# Patient Record
Sex: Male | Born: 1960 | Race: White | Hispanic: No | Marital: Married | Smoking: Former smoker
Health system: Southern US, Community
[De-identification: ages and names within clinical notes are randomized; demographics above are authoritative.]

## PROBLEM LIST (undated history)

## (undated) DIAGNOSIS — J45909 Unspecified asthma, uncomplicated: Secondary | ICD-10-CM

---

## 2018-02-11 ENCOUNTER — Inpatient Hospital Stay
Admission: EM | Admit: 2018-02-11 | Discharge: 2018-02-16 | DRG: 100 | Disposition: A | Discharge status: Home / Self Care | Payer: BLUE CROSS/BLUE SHIELD | Attending: Specialist | Admitting: Specialist

## 2018-02-11 ENCOUNTER — Encounter: Payer: Self-pay | Admitting: Emergency Medicine

## 2018-02-11 ENCOUNTER — Emergency Department: Payer: BLUE CROSS/BLUE SHIELD

## 2018-02-11 ENCOUNTER — Other Ambulatory Visit: Payer: Self-pay

## 2018-02-11 DIAGNOSIS — G4733 Obstructive sleep apnea (adult) (pediatric): Secondary | ICD-10-CM | POA: Diagnosis present

## 2018-02-11 DIAGNOSIS — J690 Pneumonitis due to inhalation of food and vomit: Secondary | ICD-10-CM | POA: Diagnosis present

## 2018-02-11 DIAGNOSIS — I4891 Unspecified atrial fibrillation: Secondary | ICD-10-CM | POA: Diagnosis present

## 2018-02-11 DIAGNOSIS — Z8249 Family history of ischemic heart disease and other diseases of the circulatory system: Secondary | ICD-10-CM | POA: Diagnosis not present

## 2018-02-11 DIAGNOSIS — S32011A Stable burst fracture of first lumbar vertebra, initial encounter for closed fracture: Secondary | ICD-10-CM

## 2018-02-11 DIAGNOSIS — J45909 Unspecified asthma, uncomplicated: Secondary | ICD-10-CM | POA: Diagnosis present

## 2018-02-11 DIAGNOSIS — R739 Hyperglycemia, unspecified: Secondary | ICD-10-CM | POA: Diagnosis not present

## 2018-02-11 DIAGNOSIS — Z79899 Other long term (current) drug therapy: Secondary | ICD-10-CM

## 2018-02-11 DIAGNOSIS — D696 Thrombocytopenia, unspecified: Secondary | ICD-10-CM | POA: Diagnosis present

## 2018-02-11 DIAGNOSIS — Z7982 Long term (current) use of aspirin: Secondary | ICD-10-CM | POA: Diagnosis not present

## 2018-02-11 DIAGNOSIS — R569 Unspecified convulsions: Secondary | ICD-10-CM

## 2018-02-11 DIAGNOSIS — Z885 Allergy status to narcotic agent status: Secondary | ICD-10-CM | POA: Diagnosis not present

## 2018-02-11 DIAGNOSIS — Z87891 Personal history of nicotine dependence: Secondary | ICD-10-CM

## 2018-02-11 DIAGNOSIS — R200 Anesthesia of skin: Secondary | ICD-10-CM | POA: Diagnosis present

## 2018-02-11 DIAGNOSIS — S01512A Laceration without foreign body of oral cavity, initial encounter: Secondary | ICD-10-CM | POA: Diagnosis present

## 2018-02-11 DIAGNOSIS — R202 Paresthesia of skin: Secondary | ICD-10-CM | POA: Diagnosis present

## 2018-02-11 DIAGNOSIS — M549 Dorsalgia, unspecified: Secondary | ICD-10-CM | POA: Diagnosis present

## 2018-02-11 DIAGNOSIS — I10 Essential (primary) hypertension: Secondary | ICD-10-CM | POA: Diagnosis present

## 2018-02-11 DIAGNOSIS — J9601 Acute respiratory failure with hypoxia: Secondary | ICD-10-CM | POA: Diagnosis present

## 2018-02-11 DIAGNOSIS — G40409 Other generalized epilepsy and epileptic syndromes, not intractable, without status epilepticus: Principal | ICD-10-CM | POA: Diagnosis present

## 2018-02-11 DIAGNOSIS — F10231 Alcohol dependence with withdrawal delirium: Secondary | ICD-10-CM | POA: Diagnosis not present

## 2018-02-11 DIAGNOSIS — G40909 Epilepsy, unspecified, not intractable, without status epilepticus: Secondary | ICD-10-CM | POA: Diagnosis not present

## 2018-02-11 DIAGNOSIS — M545 Low back pain: Secondary | ICD-10-CM | POA: Diagnosis not present

## 2018-02-11 DIAGNOSIS — J9602 Acute respiratory failure with hypercapnia: Secondary | ICD-10-CM | POA: Diagnosis not present

## 2018-02-11 DIAGNOSIS — G8929 Other chronic pain: Secondary | ICD-10-CM | POA: Diagnosis not present

## 2018-02-11 DIAGNOSIS — R0602 Shortness of breath: Secondary | ICD-10-CM

## 2018-02-11 DIAGNOSIS — J9600 Acute respiratory failure, unspecified whether with hypoxia or hypercapnia: Secondary | ICD-10-CM

## 2018-02-11 DIAGNOSIS — F10931 Alcohol use, unspecified with withdrawal delirium: Secondary | ICD-10-CM

## 2018-02-11 HISTORY — DX: Unspecified asthma, uncomplicated: J45.909

## 2018-02-11 LAB — TYPE AND SCREEN
ABO/RH(D): A POS
Antibody Screen: NEGATIVE

## 2018-02-11 LAB — COMPREHENSIVE METABOLIC PANEL
ALT: 77 U/L — ABNORMAL HIGH (ref 0–44)
AST: 132 U/L — ABNORMAL HIGH (ref 15–41)
Albumin: 4.5 g/dL (ref 3.5–5.0)
Alkaline Phosphatase: 63 U/L (ref 38–126)
Anion gap: 13 (ref 5–15)
BILIRUBIN TOTAL: 2 mg/dL — AB (ref 0.3–1.2)
BUN: 14 mg/dL (ref 6–20)
CHLORIDE: 103 mmol/L (ref 98–111)
CO2: 22 mmol/L (ref 22–32)
Calcium: 9.2 mg/dL (ref 8.9–10.3)
Creatinine, Ser: 1.12 mg/dL (ref 0.61–1.24)
GFR calc Af Amer: >60 mL/min (ref >60)
Glucose, Bld: 172 mg/dL — ABNORMAL HIGH (ref 70–99)
Potassium: 3.6 mmol/L (ref 3.5–5.1)
Sodium: 138 mmol/L (ref 135–145)
Total Protein: 7.6 g/dL (ref 6.5–8.1)

## 2018-02-11 LAB — APTT: aPTT: 26 seconds (ref 24–36)

## 2018-02-11 LAB — CBC
HEMATOCRIT: 44.2 % (ref 40.0–52.0)
Hemoglobin: 15.3 g/dL (ref 13.0–18.0)
MCH: 34.4 pg — ABNORMAL HIGH (ref 26.0–34.0)
MCHC: 34.5 g/dL (ref 32.0–36.0)
MCV: 99.9 fL (ref 80.0–100.0)
PLATELETS: 112 10*3/uL — AB (ref 150–440)
RBC: 4.43 MIL/uL (ref 4.40–5.90)
RDW: 13.4 % (ref 11.5–14.5)
WBC: 6.2 10*3/uL (ref 3.8–10.6)

## 2018-02-11 LAB — MRSA PCR SCREENING: MRSA BY PCR: NEGATIVE

## 2018-02-11 LAB — PROCALCITONIN: Procalcitonin: 0.1 ng/mL

## 2018-02-11 LAB — GLUCOSE, CAPILLARY: Glucose-Capillary: 177 mg/dL — ABNORMAL HIGH (ref 70–99)

## 2018-02-11 LAB — TROPONIN I

## 2018-02-11 LAB — PROTIME-INR
INR: 1.03
Prothrombin Time: 13.4 seconds (ref 11.4–15.2)

## 2018-02-11 MED ORDER — SODIUM CHLORIDE 0.9 % IV SOLN
INTRAVENOUS | Status: DC
  Administered 2018-02-11: 22:00:00 via INTRAVENOUS
  Administered 2018-02-12: 100 mL/h via INTRAVENOUS

## 2018-02-11 MED ORDER — LEVETIRACETAM IN NACL 1000 MG/100ML IV SOLN
1000.0000 mg | Freq: Once | INTRAVENOUS | Status: AC
  Administered 2018-02-11: 1000 mg via INTRAVENOUS
  Filled 2018-02-11: qty 100

## 2018-02-11 MED ORDER — METOPROLOL SUCCINATE ER 50 MG PO TB24
150.0000 mg | ORAL_TABLET | Freq: Every day | ORAL | Status: DC
  Administered 2018-02-12 – 2018-02-16 (×4): 150 mg via ORAL
  Filled 2018-02-11: qty 3
  Filled 2018-02-11: qty 1
  Filled 2018-02-11 (×2): qty 3

## 2018-02-11 MED ORDER — FENTANYL CITRATE (PF) 100 MCG/2ML IJ SOLN
50.0000 ug | Freq: Once | INTRAMUSCULAR | Status: AC
  Administered 2018-02-11: 50 ug via INTRAVENOUS
  Filled 2018-02-11: qty 2

## 2018-02-11 MED ORDER — SODIUM CHLORIDE 0.9 % IV SOLN
500.0000 mg | Freq: Two times a day (BID) | INTRAVENOUS | Status: DC
  Administered 2018-02-11: 500 mg via INTRAVENOUS
  Filled 2018-02-11 (×2): qty 5
  Filled 2018-02-11: qty 525

## 2018-02-11 MED ORDER — ADENOSINE 6 MG/2ML IV SOLN
INTRAVENOUS | Status: AC
  Filled 2018-02-11: qty 2

## 2018-02-11 MED ORDER — ONDANSETRON HCL 4 MG/2ML IJ SOLN
4.0000 mg | Freq: Once | INTRAMUSCULAR | Status: AC
  Administered 2018-02-11: 4 mg via INTRAVENOUS
  Filled 2018-02-11: qty 2

## 2018-02-11 MED ORDER — ASPIRIN EC 81 MG PO TBEC
81.0000 mg | DELAYED_RELEASE_TABLET | Freq: Every day | ORAL | Status: DC
  Administered 2018-02-12: 81 mg via ORAL
  Filled 2018-02-11: qty 1

## 2018-02-11 MED ORDER — GATIFLOXACIN 0.5 % OP SOLN
1.0000 [drp] | Freq: Two times a day (BID) | OPHTHALMIC | Status: DC
  Filled 2018-02-11: qty 2.5

## 2018-02-11 MED ORDER — ACETAMINOPHEN 325 MG PO TABS
650.0000 mg | ORAL_TABLET | Freq: Four times a day (QID) | ORAL | Status: DC | PRN
  Administered 2018-02-12: 650 mg via ORAL
  Filled 2018-02-11: qty 2

## 2018-02-11 MED ORDER — IOPAMIDOL (ISOVUE-300) INJECTION 61%
100.0000 mL | Freq: Once | INTRAVENOUS | Status: AC | PRN
  Administered 2018-02-11: 100 mL via INTRAVENOUS

## 2018-02-11 MED ORDER — IPRATROPIUM-ALBUTEROL 0.5-2.5 (3) MG/3ML IN SOLN
3.0000 mL | Freq: Four times a day (QID) | RESPIRATORY_TRACT | Status: DC | PRN
  Administered 2018-02-14 (×2): 3 mL via RESPIRATORY_TRACT
  Filled 2018-02-11 (×2): qty 3

## 2018-02-11 MED ORDER — ACETAMINOPHEN 650 MG RE SUPP: 650.0000 mg | Freq: Four times a day (QID) | RECTAL | Status: DC | PRN

## 2018-02-11 MED ORDER — OXYCODONE-ACETAMINOPHEN 2.5-325 MG PO TABS: 1.0000 | ORAL_TABLET | ORAL | 0 refills | Status: DC | PRN

## 2018-02-11 MED ORDER — ONDANSETRON HCL 4 MG/2ML IJ SOLN
4.0000 mg | Freq: Once | INTRAMUSCULAR | Status: AC
  Administered 2018-02-11: 4 mg via INTRAVENOUS

## 2018-02-11 MED ORDER — FENTANYL CITRATE (PF) 100 MCG/2ML IJ SOLN
100.0000 ug | Freq: Once | INTRAMUSCULAR | Status: AC
  Administered 2018-02-11: 100 ug via INTRAVENOUS
  Filled 2018-02-11: qty 2

## 2018-02-11 MED ORDER — ONDANSETRON HCL 4 MG/2ML IJ SOLN
INTRAMUSCULAR | Status: AC
  Filled 2018-02-11: qty 2

## 2018-02-11 MED ORDER — SODIUM CHLORIDE 0.9 % IV SOLN
1000.0000 mL | Freq: Once | INTRAVENOUS | Status: AC
  Administered 2018-02-11: 1000 mL via INTRAVENOUS

## 2018-02-11 MED ORDER — ONDANSETRON HCL 4 MG/2ML IJ SOLN
4.0000 mg | Freq: Four times a day (QID) | INTRAMUSCULAR | Status: DC | PRN
  Administered 2018-02-13: 4 mg via INTRAVENOUS
  Filled 2018-02-11 (×2): qty 2

## 2018-02-11 MED ORDER — SODIUM CHLORIDE 0.9 % IV BOLUS
1000.0000 mL | Freq: Once | INTRAVENOUS | Status: AC
  Administered 2018-02-11: 1000 mL via INTRAVENOUS

## 2018-02-11 MED ORDER — FENTANYL CITRATE (PF) 100 MCG/2ML IJ SOLN
INTRAMUSCULAR | Status: AC
  Administered 2018-02-11: 50 ug via INTRAVENOUS
  Filled 2018-02-11: qty 2

## 2018-02-11 MED ORDER — ONDANSETRON HCL 4 MG PO TABS: 4.0000 mg | ORAL_TABLET | Freq: Four times a day (QID) | ORAL | Status: DC | PRN

## 2018-02-11 MED ORDER — LORAZEPAM 2 MG/ML IJ SOLN
2.0000 mg | INTRAMUSCULAR | Status: DC | PRN
  Administered 2018-02-13: 2 mg via INTRAVENOUS
  Administered 2018-02-13: 1 mg via INTRAVENOUS
  Filled 2018-02-11 (×2): qty 1

## 2018-02-11 MED ORDER — TETANUS-DIPHTH-ACELL PERTUSSIS 5-2.5-18.5 LF-MCG/0.5 IM SUSP
INTRAMUSCULAR | Status: AC
  Administered 2018-02-11: 0.5 mL via INTRAMUSCULAR
  Filled 2018-02-11: qty 0.5

## 2018-02-11 MED ORDER — TETANUS-DIPHTH-ACELL PERTUSSIS 5-2.5-18.5 LF-MCG/0.5 IM SUSP
0.5000 mL | Freq: Once | INTRAMUSCULAR | Status: AC
  Administered 2018-02-11: 0.5 mL via INTRAMUSCULAR

## 2018-02-11 MED ORDER — ADENOSINE 12 MG/4ML IV SOLN
INTRAVENOUS | Status: AC
  Filled 2018-02-11: qty 4

## 2018-02-11 MED ORDER — FENTANYL CITRATE (PF) 100 MCG/2ML IJ SOLN
50.0000 ug | Freq: Once | INTRAMUSCULAR | Status: AC
  Administered 2018-02-11: 50 ug via INTRAVENOUS

## 2018-02-11 MED ORDER — MORPHINE SULFATE (PF) 4 MG/ML IV SOLN: 4.0000 mg | Freq: Once | INTRAVENOUS | Status: DC

## 2018-02-11 MED ORDER — LEVETIRACETAM IN NACL 500 MG/100ML IV SOLN: 500.0000 mg | Freq: Two times a day (BID) | INTRAVENOUS | Status: DC

## 2018-02-11 MED ORDER — METOPROLOL TARTRATE 5 MG/5ML IV SOLN
2.5000 mg | INTRAVENOUS | Status: DC | PRN
  Administered 2018-02-14: 2.5 mg via INTRAVENOUS
  Filled 2018-02-11: qty 5

## 2018-02-11 MED ORDER — ENOXAPARIN SODIUM 40 MG/0.4ML ~~LOC~~ SOLN
40.0000 mg | SUBCUTANEOUS | Status: DC
  Administered 2018-02-11 – 2018-02-12 (×2): 40 mg via SUBCUTANEOUS
  Filled 2018-02-11 (×2): qty 0.4

## 2018-02-11 MED ORDER — LORAZEPAM 2 MG/ML IJ SOLN
2.0000 mg | Freq: Once | INTRAMUSCULAR | Status: AC
  Administered 2018-02-11: 2 mg via INTRAVENOUS

## 2018-02-11 NOTE — ED Notes (Signed)
Pt desat to 92% on 2L after receiving 100mcg of Fentanyl, pt up to 3L via [place], O2 sats 96% on 3L. Dr. Marcell BarlowYarborough also to bedside at this time.

## 2018-02-11 NOTE — ED Notes (Signed)
TLSO brace applied by this RN and Dorian, EDT.

## 2018-02-11 NOTE — ED Notes (Signed)
Pt being transported to MRI by Joni ReiningNicole, EDT. As discussed with MRI, TLSO brace to be applied after patient has had MRI of Lumbar Spine.

## 2018-02-11 NOTE — ED Notes (Signed)
Called [employer] Tech for TLSO brace   [phone]

## 2018-02-11 NOTE — ED Notes (Signed)
Explained to patient 20 min med hold then discharge, patient and family state understanding. Care handoff to Beaulah Corinebecca U, RN.

## 2018-02-11 NOTE — ED Notes (Addendum)
Patient was in wheelchair for d/c and IV was pulled and nurse was wheeling patient out the door of room and patient seized and bit tongue and turned grey and pale and and nurses and dr rushed into help and was placed back on bed and IV were started, ativan was given, EKG were done and patient's vitals were obtained and placed on non-rebreather at 15L and patient has snoring respirations and non-arousable at this time.

## 2018-02-11 NOTE — ED Notes (Signed)
Pt returned from MRI °

## 2018-02-11 NOTE — ED Notes (Signed)
Patient opened eye and said hi to wife when she talked to him. Patient back to snoring and rest.
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
## 2018-02-11 NOTE — ED Notes (Signed)
Dr. Pershing ProudSchaevitz at bedside to assess patient and patient slightly stirred.

## 2018-02-11 NOTE — ED Notes (Signed)
Family at bedside. 

## 2018-02-11 NOTE — ED Notes (Signed)
Pt given phone for MRI screening.  

## 2018-02-11 NOTE — ED Notes (Signed)
Admitting MD at bedside to talk to wife about admission. Verbal order for zofran obtained for nausea because patient stated he felt like he was going to throw up
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
## 2018-02-11 NOTE — ED Notes (Signed)
EDP to bedside at this time 
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
## 2018-02-11 NOTE — ED Notes (Signed)
Pt returned from CT with this RN. Pt no longer noted to be diaphoretic, HR now 103-106. Pt continues to c/o pain to lower back. BP 120/90 at this time.
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
## 2018-02-11 NOTE — ED Notes (Addendum)
MD present in room to assess patient. Patient responded to touch and was aroused by touch and voice and squeezed MD's hands and wiggled feet on command.

## 2018-02-11 NOTE — ED Notes (Signed)
Patient was repositioned and then had two episodes of vomiting. Patient was able to maintain O2 for a few min off oxygen while vomiting and placed back on non-rebreather after vomiting episodes.

## 2018-02-11 NOTE — Discharge Instructions (Addendum)
Do not drive until cleared by a physician.
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
## 2018-02-11 NOTE — ED Notes (Signed)
Rebecca RN, aware of bed assigned 
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
## 2018-02-12 ENCOUNTER — Inpatient Hospital Stay: Payer: BLUE CROSS/BLUE SHIELD

## 2018-02-12 DIAGNOSIS — R569 Unspecified convulsions: Secondary | ICD-10-CM

## 2018-02-12 LAB — URINE DRUG SCREEN, QUALITATIVE (ARMC ONLY)
AMPHETAMINES, UR SCREEN: NOT DETECTED
BENZODIAZEPINE, UR SCRN: NOT DETECTED
Barbiturates, Ur Screen: NOT DETECTED
Cannabinoid 50 Ng, Ur ~~LOC~~: NOT DETECTED
Cocaine Metabolite,Ur ~~LOC~~: NOT DETECTED
MDMA (Ecstasy)Ur Screen: NOT DETECTED
METHADONE SCREEN, URINE: NOT DETECTED
OPIATE, UR SCREEN: NOT DETECTED
PHENCYCLIDINE (PCP) UR S: NOT DETECTED
Tricyclic, Ur Screen: NOT DETECTED

## 2018-02-12 LAB — BASIC METABOLIC PANEL
ANION GAP: 7 (ref 5–15)
BUN: 13 mg/dL (ref 6–20)
CALCIUM: 8 mg/dL — AB (ref 8.9–10.3)
CHLORIDE: 105 mmol/L (ref 98–111)
CO2: 26 mmol/L (ref 22–32)
Creatinine, Ser: 1.03 mg/dL (ref 0.61–1.24)
GFR calc non Af Amer: >60 mL/min (ref >60)
GLUCOSE: 168 mg/dL — AB (ref 70–99)
POTASSIUM: 3.1 mmol/L — AB (ref 3.5–5.1)
SODIUM: 138 mmol/L (ref 135–145)

## 2018-02-12 LAB — PHOSPHORUS: Phosphorus: 2.9 mg/dL (ref 2.5–4.6)

## 2018-02-12 LAB — MAGNESIUM: Magnesium: 1.9 mg/dL (ref 1.7–2.4)

## 2018-02-12 LAB — CBC
HEMATOCRIT: 36.5 % — AB (ref 40.0–52.0)
HEMOGLOBIN: 12.7 g/dL — AB (ref 13.0–18.0)
MCH: 34.9 pg — ABNORMAL HIGH (ref 26.0–34.0)
MCHC: 34.9 g/dL (ref 32.0–36.0)
MCV: 100 fL (ref 80.0–100.0)
Platelets: 81 10*3/uL — ABNORMAL LOW (ref 150–440)
RBC: 3.65 MIL/uL — AB (ref 4.40–5.90)
RDW: 13.4 % (ref 11.5–14.5)
WBC: 8.3 10*3/uL (ref 3.8–10.6)

## 2018-02-12 LAB — GLUCOSE, CAPILLARY
GLUCOSE-CAPILLARY: 206 mg/dL — AB (ref 70–99)
Glucose-Capillary: 123 mg/dL — ABNORMAL HIGH (ref 70–99)

## 2018-02-12 MED ORDER — INSULIN ASPART 100 UNIT/ML ~~LOC~~ SOLN: 0.0000 [IU] | Freq: Every day | SUBCUTANEOUS | Status: DC

## 2018-02-12 MED ORDER — MAGNESIUM SULFATE IN D5W 1-5 GM/100ML-% IV SOLN
1.0000 g | Freq: Once | INTRAVENOUS | Status: AC
  Administered 2018-02-12: 1 g via INTRAVENOUS
  Filled 2018-02-12: qty 100

## 2018-02-12 MED ORDER — LEVETIRACETAM 500 MG PO TABS
500.0000 mg | ORAL_TABLET | Freq: Two times a day (BID) | ORAL | Status: DC
  Administered 2018-02-12 (×2): 500 mg via ORAL
  Filled 2018-02-12 (×5): qty 1

## 2018-02-12 MED ORDER — TOBRAMYCIN 0.3 % OP SOLN
1.0000 [drp] | Freq: Four times a day (QID) | OPHTHALMIC | Status: DC
  Administered 2018-02-12 – 2018-02-14 (×8): 1 [drp] via OPHTHALMIC
  Filled 2018-02-12: qty 5

## 2018-02-12 MED ORDER — FAMOTIDINE 20 MG PO TABS
20.0000 mg | ORAL_TABLET | Freq: Two times a day (BID) | ORAL | Status: DC
  Administered 2018-02-12: 20 mg via ORAL
  Filled 2018-02-12: qty 1

## 2018-02-12 MED ORDER — KETOROLAC TROMETHAMINE 15 MG/ML IJ SOLN
15.0000 mg | Freq: Four times a day (QID) | INTRAMUSCULAR | Status: DC | PRN
  Administered 2018-02-12 – 2018-02-15 (×4): 15 mg via INTRAVENOUS
  Filled 2018-02-12 (×4): qty 1

## 2018-02-12 MED ORDER — GADOBENATE DIMEGLUMINE 529 MG/ML IV SOLN
20.0000 mL | Freq: Once | INTRAVENOUS | Status: AC | PRN
  Administered 2018-02-12: 20 mL via INTRAVENOUS

## 2018-02-12 MED ORDER — PSYLLIUM 95 % PO PACK: 1.0000 | PACK | Freq: Every day | ORAL | Status: DC

## 2018-02-12 MED ORDER — POTASSIUM CHLORIDE 2 MEQ/ML IV SOLN
INTRAVENOUS | Status: DC
  Administered 2018-02-12 (×2): via INTRAVENOUS
  Filled 2018-02-12 (×4): qty 1000

## 2018-02-12 MED ORDER — POTASSIUM CHLORIDE 10 MEQ/100ML IV SOLN
10.0000 meq | INTRAVENOUS | Status: AC
  Administered 2018-02-12 (×4): 10 meq via INTRAVENOUS
  Filled 2018-02-12 (×4): qty 100

## 2018-02-12 MED ORDER — INSULIN ASPART 100 UNIT/ML ~~LOC~~ SOLN
0.0000 [IU] | Freq: Three times a day (TID) | SUBCUTANEOUS | Status: DC
  Administered 2018-02-14 – 2018-02-15 (×3): 2 [IU] via SUBCUTANEOUS
  Administered 2018-02-15 – 2018-02-16 (×2): 3 [IU] via SUBCUTANEOUS
  Administered 2018-02-16: 2 [IU] via SUBCUTANEOUS
  Filled 2018-02-12 (×6): qty 1

## 2018-02-12 MED ORDER — PSYLLIUM 95 % PO PACK
1.0000 | PACK | Freq: Every day | ORAL | Status: DC
  Administered 2018-02-12 – 2018-02-15 (×2): 1 via ORAL
  Filled 2018-02-12 (×5): qty 1

## 2018-02-12 MED ORDER — ASPIRIN 81 MG PO CHEW
81.0000 mg | CHEWABLE_TABLET | Freq: Every day | ORAL | Status: DC
  Administered 2018-02-13 – 2018-02-16 (×3): 81 mg via ORAL
  Filled 2018-02-12 (×3): qty 1

## 2018-02-12 MED ORDER — POTASSIUM CHLORIDE 2 MEQ/ML IV SOLN
INTRAVENOUS | Status: DC
  Filled 2018-02-12 (×2): qty 1000

## 2018-02-12 NOTE — Procedures (Signed)
ELECTROENCEPHALOGRAM REPORT   Patient: Bryan Ballard [place]       Room #: IC17A-AA EEG No. ID: [id number] Age: 57 y.o.        Sex: male Referring Physician: Salary Report Date:  02/12/2018        Interpreting Physician: Thana FarrEYNOLDS, Tito Ausmus  History: Bryan Ballard Karman is an 57 y.o. male with new onset seizures  Medications:  ASA, Keppra, Magnesium Sulfate, Toprol  Conditions of Recording:  This is a 21 channel routine scalp EEG performed with bipolar and monopolar montages arranged in accordance to the international 10/20 system of electrode placement. One channel was dedicated to EKG recording.  The patient is in the awake state.  Description:  Muscle and movement artifact are prominent during the recording.  It often obscures the background rhythm and preventing it from being  to be evaluated.  There are rare occasions that a posterior background rhythm can be appreciated at 9 Hz alpha.   The patient does not appear to drowse or sleep. Hyperventilation and intermittent photic stimulation were not performed   IMPRESSION: This is a technically difficult electroencephalogram secondary to the predominance of muscle and movement artifact that obscures the background rhythm and precludes it evaluation.     Thana FarrLeslie Nox Talent, MD Neurology [phone] 02/12/2018, 3:17 PM
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
## 2018-02-12 NOTE — Progress Notes (Signed)
   02/12/18 0000  Clinical Encounter Type  Visited With Patient  Visit Type Initial;Spiritual support  Referral From Nurse  Spiritual Encounters  Spiritual Needs Prayer   Chaplain prayed for the sleeping patient and told the nurse that a chaplain would return on 02/12/2018 to pray with the patient. The OR remains open.

## 2018-02-12 NOTE — Progress Notes (Signed)
Patient is still sleeping. Chaplain will forward OR for prayer to the incoming on-call chaplain.

## 2018-02-12 NOTE — Progress Notes (Signed)
   02/12/18 1120  Clinical Encounter Type  Visited With Family  Visit Type Follow-up   Chaplain attempted to follow up on the order requisition.  Patient had not yet returned from MRI. Chaplain engaged patient's spouse regarding faith and patient's health situation.  Patient did return to room yet had some personal care issues to tend to.  Spouse asked chaplain to return later today.
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
## 2018-02-12 NOTE — Progress Notes (Signed)
eeg completed ° °

## 2018-02-12 NOTE — Progress Notes (Signed)
   02/12/18 1315  Clinical Encounter Type  Visited With Patient and family together  Visit Type Follow-up  Referral From Nurse  Consult/Referral To Chaplain  Spiritual Encounters  Spiritual Needs Emotional;Prayer   Chaplain followed up with patient and family regarding order for prayer.  Patient spoke about need for rest.  Chaplain outlined ongoing availability and encouraged patient/family to reach out as needed.  Chaplain opened prayer with patient and spouse, allowing them space to offer their prayers as well.

## 2018-02-12 NOTE — Progress Notes (Signed)
Patient left unit at this time to go for an MRI of the brain.

## 2018-02-13 ENCOUNTER — Inpatient Hospital Stay: Payer: BLUE CROSS/BLUE SHIELD

## 2018-02-13 DIAGNOSIS — G8929 Other chronic pain: Secondary | ICD-10-CM

## 2018-02-13 DIAGNOSIS — J9601 Acute respiratory failure with hypoxia: Secondary | ICD-10-CM

## 2018-02-13 DIAGNOSIS — M545 Low back pain: Secondary | ICD-10-CM

## 2018-02-13 DIAGNOSIS — F10231 Alcohol dependence with withdrawal delirium: Secondary | ICD-10-CM

## 2018-02-13 DIAGNOSIS — G40909 Epilepsy, unspecified, not intractable, without status epilepticus: Secondary | ICD-10-CM

## 2018-02-13 DIAGNOSIS — J9602 Acute respiratory failure with hypercapnia: Secondary | ICD-10-CM

## 2018-02-13 LAB — BLOOD GAS, ARTERIAL
Acid-Base Excess: 1 mmol/L (ref 0.0–2.0)
Acid-Base Excess: 1.1 mmol/L (ref 0.0–2.0)
Bicarbonate: 29.8 mmol/L — ABNORMAL HIGH (ref 20.0–28.0)
Bicarbonate: 27.9 mmol/L (ref 20.0–28.0)
FIO2: 0.45
FIO2: 0.45
O2 Saturation: 95.1 %
O2 Saturation: 91.5 %
PATIENT TEMPERATURE: 37
PCO2 ART: 53 mmHg — AB (ref 32.0–48.0)
PH ART: 7.33 — AB (ref 7.350–7.450)
Patient temperature: 37
pCO2 arterial: 68 mmHg (ref 32.0–48.0)
pH, Arterial: 7.25 — ABNORMAL LOW (ref 7.350–7.450)
pO2, Arterial: 88 mmHg (ref 83.0–108.0)
pO2, Arterial: 67 mmHg — ABNORMAL LOW (ref 83.0–108.0)

## 2018-02-13 LAB — CBC
HEMATOCRIT: 34.3 % — AB (ref 40.0–52.0)
HEMOGLOBIN: 12 g/dL — AB (ref 13.0–18.0)
MCH: 35 pg — ABNORMAL HIGH (ref 26.0–34.0)
MCHC: 34.9 g/dL (ref 32.0–36.0)
MCV: 100.2 fL — ABNORMAL HIGH (ref 80.0–100.0)
Platelets: 67 10*3/uL — ABNORMAL LOW (ref 150–440)
RBC: 3.42 MIL/uL — ABNORMAL LOW (ref 4.40–5.90)
RDW: 13.5 % (ref 11.5–14.5)
WBC: 5.9 10*3/uL (ref 3.8–10.6)

## 2018-02-13 LAB — MAGNESIUM: Magnesium: 1.9 mg/dL (ref 1.7–2.4)

## 2018-02-13 LAB — BASIC METABOLIC PANEL
ANION GAP: 7 (ref 5–15)
BUN: 12 mg/dL (ref 6–20)
CHLORIDE: 102 mmol/L (ref 98–111)
CO2: 25 mmol/L (ref 22–32)
CREATININE: 1.09 mg/dL (ref 0.61–1.24)
Calcium: 7.7 mg/dL — ABNORMAL LOW (ref 8.9–10.3)
GFR calc non Af Amer: >60 mL/min (ref >60)
Glucose, Bld: 120 mg/dL — ABNORMAL HIGH (ref 70–99)
Potassium: 3.3 mmol/L — ABNORMAL LOW (ref 3.5–5.1)
Sodium: 134 mmol/L — ABNORMAL LOW (ref 135–145)

## 2018-02-13 LAB — GLUCOSE, CAPILLARY
GLUCOSE-CAPILLARY: 110 mg/dL — AB (ref 70–99)
Glucose-Capillary: 120 mg/dL — ABNORMAL HIGH (ref 70–99)
Glucose-Capillary: 104 mg/dL — ABNORMAL HIGH (ref 70–99)
Glucose-Capillary: 110 mg/dL — ABNORMAL HIGH (ref 70–99)

## 2018-02-13 LAB — HIV ANTIBODY (ROUTINE TESTING W REFLEX): HIV Screen 4th Generation wRfx: NONREACTIVE

## 2018-02-13 LAB — PHOSPHORUS: PHOSPHORUS: 2.2 mg/dL — AB (ref 2.5–4.6)

## 2018-02-13 MED ORDER — ADULT MULTIVITAMIN W/MINERALS CH
1.0000 | ORAL_TABLET | Freq: Every day | ORAL | Status: DC
  Administered 2018-02-15 – 2018-02-16 (×2): 1 via ORAL
  Filled 2018-02-13 (×2): qty 1

## 2018-02-13 MED ORDER — FOLIC ACID 1 MG PO TABS
1.0000 mg | ORAL_TABLET | Freq: Every day | ORAL | Status: DC
  Administered 2018-02-15 – 2018-02-16 (×2): 1 mg via ORAL
  Filled 2018-02-13 (×2): qty 1

## 2018-02-13 MED ORDER — LEVETIRACETAM IN NACL 500 MG/100ML IV SOLN
500.0000 mg | Freq: Two times a day (BID) | INTRAVENOUS | Status: DC
  Administered 2018-02-13 – 2018-02-14 (×2): 500 mg via INTRAVENOUS
  Filled 2018-02-13: qty 500
  Filled 2018-02-13 (×2): qty 100
  Filled 2018-02-13: qty 500

## 2018-02-13 MED ORDER — LEVETIRACETAM IN NACL 500 MG/100ML IV SOLN
500.0000 mg | Freq: Two times a day (BID) | INTRAVENOUS | Status: DC
  Filled 2018-02-13 (×2): qty 100

## 2018-02-13 MED ORDER — POTASSIUM CHLORIDE CRYS ER 20 MEQ PO TBCR
40.0000 meq | EXTENDED_RELEASE_TABLET | Freq: Once | ORAL | Status: DC
  Filled 2018-02-13: qty 2

## 2018-02-13 MED ORDER — DEXMEDETOMIDINE HCL IN NACL 400 MCG/100ML IV SOLN
0.4000 ug/kg/h | INTRAVENOUS | Status: DC
  Administered 2018-02-13: 1 ug/kg/h via INTRAVENOUS
  Administered 2018-02-13: 0.8 ug/kg/h via INTRAVENOUS
  Administered 2018-02-13 – 2018-02-14 (×2): 1 ug/kg/h via INTRAVENOUS
  Administered 2018-02-14 (×3): 0.8 ug/kg/h via INTRAVENOUS
  Administered 2018-02-14: 1 ug/kg/h via INTRAVENOUS
  Administered 2018-02-15: 0.7 ug/kg/h via INTRAVENOUS
  Filled 2018-02-13 (×10): qty 100

## 2018-02-13 MED ORDER — LORAZEPAM 2 MG/ML IJ SOLN
2.0000 mg | Freq: Once | INTRAMUSCULAR | Status: AC
  Administered 2018-02-13: 2 mg via INTRAVENOUS
  Filled 2018-02-13: qty 1

## 2018-02-13 MED ORDER — LORAZEPAM 2 MG/ML IJ SOLN: 1.0000 mg | INTRAMUSCULAR | Status: DC | PRN

## 2018-02-13 MED ORDER — ORAL CARE MOUTH RINSE
15.0000 mL | Freq: Two times a day (BID) | OROMUCOSAL | Status: DC
  Administered 2018-02-14 (×2): 15 mL via OROMUCOSAL

## 2018-02-13 MED ORDER — K PHOS MONO-SOD PHOS DI & MONO 155-852-130 MG PO TABS
500.0000 mg | ORAL_TABLET | ORAL | Status: DC
  Filled 2018-02-13 (×2): qty 2

## 2018-02-13 MED ORDER — LORAZEPAM 2 MG/ML IJ SOLN
2.0000 mg | Freq: Once | INTRAMUSCULAR | Status: AC
  Administered 2018-02-13: 2 mg via INTRAVENOUS

## 2018-02-13 MED ORDER — POTASSIUM CHLORIDE 20 MEQ PO PACK: 40.0000 meq | PACK | Freq: Once | ORAL | Status: DC

## 2018-02-13 MED ORDER — VITAMIN B-1 100 MG PO TABS
100.0000 mg | ORAL_TABLET | Freq: Every day | ORAL | Status: DC
  Administered 2018-02-15 – 2018-02-16 (×2): 100 mg via ORAL
  Filled 2018-02-13 (×2): qty 1

## 2018-02-13 MED ORDER — DEXMEDETOMIDINE HCL IN NACL 400 MCG/100ML IV SOLN
0.2000 ug/kg/h | INTRAVENOUS | Status: DC
  Administered 2018-02-13: 0.4 ug/kg/h via INTRAVENOUS
  Filled 2018-02-13: qty 100

## 2018-02-13 MED ORDER — LORAZEPAM 2 MG/ML IJ SOLN
1.0000 mg | INTRAMUSCULAR | Status: DC | PRN
  Administered 2018-02-13 – 2018-02-14 (×4): 2 mg via INTRAVENOUS
  Filled 2018-02-13 (×5): qty 1

## 2018-02-13 MED ORDER — MAGNESIUM SULFATE 2 GM/50ML IV SOLN
2.0000 g | Freq: Once | INTRAVENOUS | Status: AC
  Administered 2018-02-13: 2 g via INTRAVENOUS
  Filled 2018-02-13: qty 50

## 2018-02-13 MED ORDER — PANTOPRAZOLE SODIUM 40 MG IV SOLR
40.0000 mg | INTRAVENOUS | Status: DC
  Administered 2018-02-14 – 2018-02-16 (×3): 40 mg via INTRAVENOUS
  Filled 2018-02-13 (×3): qty 40

## 2018-02-13 MED ORDER — POTASSIUM CHLORIDE 10 MEQ/100ML IV SOLN
10.0000 meq | INTRAVENOUS | Status: AC
  Administered 2018-02-13 (×4): 10 meq via INTRAVENOUS
  Filled 2018-02-13 (×4): qty 100

## 2018-02-13 MED ORDER — CHLORHEXIDINE GLUCONATE 0.12 % MT SOLN
15.0000 mL | Freq: Two times a day (BID) | OROMUCOSAL | Status: DC
  Administered 2018-02-13 – 2018-02-16 (×6): 15 mL via OROMUCOSAL
  Filled 2018-02-13 (×5): qty 15

## 2018-02-13 MED ORDER — PANTOPRAZOLE SODIUM 40 MG PO TBEC
40.0000 mg | DELAYED_RELEASE_TABLET | Freq: Every day | ORAL | Status: DC
  Administered 2018-02-13: 40 mg via ORAL
  Filled 2018-02-13: qty 1

## 2018-02-13 NOTE — Progress Notes (Signed)
Patient vomited in BiPap. Mouth suctioned. Patient placed on venturi mask. ICU MD notified and came to bedside. Received new orders for another ABG. PRN nausea med given. Will continue to monitor.

## 2018-02-13 NOTE — Progress Notes (Signed)
Patient awake and pulling at BiPap and IVs. Patient placed on Venturi mask. PRN artvan given.
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
## 2018-02-13 NOTE — Progress Notes (Signed)
   02/13/18 1045  Clinical Encounter Type  Visited With Patient and family together  Visit Type Follow-up   Chaplain attempted follow up after morning progression.  Patient's spouse reported that today 'was not a good day' for a visit.  Chaplain encouraged them to page if needs changed.
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
## 2018-02-13 NOTE — Progress Notes (Signed)
Patient had increased work of breathing, using accessory muscles to breath and RR in mid 20s. ICU MD notified. Received new orders to for BiPap and ABG.

## 2018-02-14 ENCOUNTER — Inpatient Hospital Stay: Payer: BLUE CROSS/BLUE SHIELD

## 2018-02-14 DIAGNOSIS — J9600 Acute respiratory failure, unspecified whether with hypoxia or hypercapnia: Secondary | ICD-10-CM

## 2018-02-14 DIAGNOSIS — S01512A Laceration without foreign body of oral cavity, initial encounter: Secondary | ICD-10-CM

## 2018-02-14 DIAGNOSIS — M549 Dorsalgia, unspecified: Secondary | ICD-10-CM

## 2018-02-14 DIAGNOSIS — F10931 Alcohol use, unspecified with withdrawal delirium: Secondary | ICD-10-CM

## 2018-02-14 DIAGNOSIS — F10231 Alcohol dependence with withdrawal delirium: Secondary | ICD-10-CM

## 2018-02-14 LAB — BASIC METABOLIC PANEL
Anion gap: 8 (ref 5–15)
BUN: 17 mg/dL (ref 6–20)
CHLORIDE: 104 mmol/L (ref 98–111)
CO2: 25 mmol/L (ref 22–32)
Calcium: 8.6 mg/dL — ABNORMAL LOW (ref 8.9–10.3)
Creatinine, Ser: 0.87 mg/dL (ref 0.61–1.24)
GFR calc Af Amer: >60 mL/min (ref >60)
GLUCOSE: 119 mg/dL — AB (ref 70–99)
POTASSIUM: 4.4 mmol/L (ref 3.5–5.1)
Sodium: 137 mmol/L (ref 135–145)

## 2018-02-14 LAB — GLUCOSE, CAPILLARY
GLUCOSE-CAPILLARY: 99 mg/dL (ref 70–99)
GLUCOSE-CAPILLARY: 150 mg/dL — AB (ref 70–99)
Glucose-Capillary: 119 mg/dL — ABNORMAL HIGH (ref 70–99)
Glucose-Capillary: 133 mg/dL — ABNORMAL HIGH (ref 70–99)

## 2018-02-14 LAB — CBC
HEMATOCRIT: 37.9 % — AB (ref 40.0–52.0)
Hemoglobin: 13.3 g/dL (ref 13.0–18.0)
MCH: 35.1 pg — ABNORMAL HIGH (ref 26.0–34.0)
MCHC: 35 g/dL (ref 32.0–36.0)
MCV: 100.2 fL — AB (ref 80.0–100.0)
Platelets: 76 10*3/uL — ABNORMAL LOW (ref 150–440)
RBC: 3.78 MIL/uL — ABNORMAL LOW (ref 4.40–5.90)
RDW: 13.1 % (ref 11.5–14.5)
WBC: 7 10*3/uL (ref 3.8–10.6)

## 2018-02-14 LAB — PHOSPHORUS: Phosphorus: 2.6 mg/dL (ref 2.5–4.6)

## 2018-02-14 LAB — MAGNESIUM: Magnesium: 1.9 mg/dL (ref 1.7–2.4)

## 2018-02-14 LAB — PROCALCITONIN: PROCALCITONIN: 0.24 ng/mL

## 2018-02-14 MED ORDER — DEXAMETHASONE SODIUM PHOSPHATE 4 MG/ML IJ SOLN
4.0000 mg | Freq: Two times a day (BID) | INTRAMUSCULAR | Status: DC
  Administered 2018-02-15 – 2018-02-16 (×3): 4 mg via INTRAVENOUS
  Filled 2018-02-14 (×4): qty 1

## 2018-02-14 MED ORDER — SODIUM CHLORIDE 0.9 % IV SOLN
500.0000 mg | Freq: Two times a day (BID) | INTRAVENOUS | Status: DC
  Administered 2018-02-14 – 2018-02-16 (×4): 500 mg via INTRAVENOUS
  Filled 2018-02-14: qty 525
  Filled 2018-02-14 (×2): qty 5
  Filled 2018-02-14: qty 525
  Filled 2018-02-14: qty 5
  Filled 2018-02-14: qty 525

## 2018-02-14 MED ORDER — RACEPINEPHRINE HCL 2.25 % IN NEBU
0.5000 mL | INHALATION_SOLUTION | Freq: Once | RESPIRATORY_TRACT | Status: AC
  Administered 2018-02-14: 0.5 mL via RESPIRATORY_TRACT
  Filled 2018-02-14: qty 0.5

## 2018-02-14 MED ORDER — DEXAMETHASONE SODIUM PHOSPHATE 4 MG/ML IJ SOLN
8.0000 mg | Freq: Once | INTRAMUSCULAR | Status: AC
  Administered 2018-02-14: 8 mg via INTRAVENOUS
  Filled 2018-02-14: qty 2

## 2018-02-14 MED ORDER — PHENOL 1.4 % MT LIQD
1.0000 | OROMUCOSAL | Status: DC | PRN
  Administered 2018-02-14: 1 via OROMUCOSAL
  Filled 2018-02-14: qty 177

## 2018-02-14 MED ORDER — STERILE WATER FOR INJECTION IJ SOLN
INTRAMUSCULAR | Status: AC
  Administered 2018-02-14: 10 mL
  Filled 2018-02-14: qty 10

## 2018-02-14 MED ORDER — LACTATED RINGERS IV SOLN
INTRAVENOUS | Status: DC
  Administered 2018-02-14 – 2018-02-15 (×3): via INTRAVENOUS

## 2018-02-14 MED ORDER — FENTANYL CITRATE (PF) 100 MCG/2ML IJ SOLN
INTRAMUSCULAR | Status: AC
  Filled 2018-02-14: qty 2

## 2018-02-14 MED ORDER — ENOXAPARIN SODIUM 40 MG/0.4ML ~~LOC~~ SOLN
40.0000 mg | SUBCUTANEOUS | Status: DC
  Administered 2018-02-14 – 2018-02-15 (×2): 40 mg via SUBCUTANEOUS
  Filled 2018-02-14 (×2): qty 0.4

## 2018-02-14 MED ORDER — SODIUM CHLORIDE 0.9 % IV BOLUS
500.0000 mL | INTRAVENOUS | Status: AC
  Administered 2018-02-14: 500 mL via INTRAVENOUS

## 2018-02-14 MED ORDER — HYDRALAZINE HCL 20 MG/ML IJ SOLN: 10.0000 mg | INTRAMUSCULAR | Status: DC

## 2018-02-14 MED ORDER — MIDAZOLAM HCL 2 MG/2ML IJ SOLN
INTRAMUSCULAR | Status: AC
  Filled 2018-02-14: qty 2

## 2018-02-14 MED ORDER — SODIUM CHLORIDE 0.9 % IV BOLUS: 500.0000 mL | Freq: Once | INTRAVENOUS | Status: DC

## 2018-02-14 MED ORDER — TOBRAMYCIN 0.3 % OP SOLN
1.0000 [drp] | Freq: Four times a day (QID) | OPHTHALMIC | Status: DC
  Administered 2018-02-14 – 2018-02-16 (×7): 1 [drp] via OPHTHALMIC
  Filled 2018-02-14: qty 5

## 2018-02-14 MED ORDER — HYDRALAZINE HCL 20 MG/ML IJ SOLN
10.0000 mg | INTRAMUSCULAR | Status: DC | PRN
  Administered 2018-02-15: 10 mg via INTRAVENOUS
  Filled 2018-02-14: qty 1

## 2018-02-14 MED ORDER — NYSTATIN 100000 UNIT/ML MT SUSP
5.0000 mL | Freq: Four times a day (QID) | OROMUCOSAL | Status: DC
  Administered 2018-02-14 – 2018-02-16 (×5): 500000 [IU] via OROMUCOSAL
  Filled 2018-02-14 (×8): qty 5
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
## 2018-02-14 NOTE — Progress Notes (Signed)
Pt taken off bipap, placed on 45% venti mask, sats 98%, respiratory rate 28/min, will continue to monitor, RN aware
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
## 2018-02-14 NOTE — Plan of Care (Signed)
Patient alert and responsive at times during shift. Becomes anxious and agitated at times. Given Ativan x 2 with good effect.  Wife at bedside. Remained on BiPap throughout shift. Awakened and took Bipap off.  Able to tolerate for few minutes. Patient makes great effort to breath when off BiPap.  Poor urine output.  Bolus infusing at this time.  Will continue to monitor.
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
## 2018-02-15 LAB — CBC
HCT: 39.2 % — ABNORMAL LOW (ref 40.0–52.0)
HEMOGLOBIN: 13.8 g/dL (ref 13.0–18.0)
MCH: 34.8 pg — ABNORMAL HIGH (ref 26.0–34.0)
MCHC: 35.1 g/dL (ref 32.0–36.0)
MCV: 99 fL (ref 80.0–100.0)
PLATELETS: 97 10*3/uL — AB (ref 150–440)
RBC: 3.96 MIL/uL — ABNORMAL LOW (ref 4.40–5.90)
RDW: 13.1 % (ref 11.5–14.5)
WBC: 4.2 10*3/uL (ref 3.8–10.6)

## 2018-02-15 LAB — BASIC METABOLIC PANEL
ANION GAP: 11 (ref 5–15)
BUN: 13 mg/dL (ref 6–20)
CALCIUM: 8.8 mg/dL — AB (ref 8.9–10.3)
CO2: 24 mmol/L (ref 22–32)
CREATININE: 0.84 mg/dL (ref 0.61–1.24)
Chloride: 99 mmol/L (ref 98–111)
GFR calc Af Amer: >60 mL/min (ref >60)
GLUCOSE: 163 mg/dL — AB (ref 70–99)
Potassium: 4.4 mmol/L (ref 3.5–5.1)
SODIUM: 134 mmol/L — AB (ref 135–145)

## 2018-02-15 LAB — GLUCOSE, CAPILLARY
GLUCOSE-CAPILLARY: 162 mg/dL — AB (ref 70–99)
Glucose-Capillary: 158 mg/dL — ABNORMAL HIGH (ref 70–99)
Glucose-Capillary: 124 mg/dL — ABNORMAL HIGH (ref 70–99)
Glucose-Capillary: 144 mg/dL — ABNORMAL HIGH (ref 70–99)

## 2018-02-15 LAB — MAGNESIUM: MAGNESIUM: 1.8 mg/dL (ref 1.7–2.4)

## 2018-02-15 LAB — PHOSPHORUS: PHOSPHORUS: 3.6 mg/dL (ref 2.5–4.6)

## 2018-02-15 MED ORDER — STERILE WATER FOR INJECTION IJ SOLN
INTRAMUSCULAR | Status: AC
  Administered 2018-02-15: 10 mL
  Filled 2018-02-15: qty 10

## 2018-02-15 MED ORDER — AMOXICILLIN-POT CLAVULANATE 875-125 MG PO TABS
1.0000 | ORAL_TABLET | Freq: Two times a day (BID) | ORAL | Status: DC
  Administered 2018-02-15 – 2018-02-16 (×3): 1 via ORAL
  Filled 2018-02-15 (×3): qty 1

## 2018-02-15 NOTE — Progress Notes (Signed)
Pharmacy Electrolyte Monitoring Consult:  Pharmacy consulted to assist in monitoring and replacing electrolytes in this 57 y.o. male admitted on 02/11/2018 with Loss of Consciousness seizure  Labs:  Sodium (mmol/L)  Date Value  02/15/2018 134 (L)   Potassium (mmol/L)  Date Value  02/15/2018 4.4   Magnesium (mg/dL)  Date Value  72/53/664409/22/2019 1.8   Phosphorus (mg/dL)  Date Value  03/47/425909/22/2019 3.6   Calcium (mg/dL)  Date Value  56/38/756409/22/2019 8.8 (L)   Albumin (g/dL)  Date Value  33/29/518809/18/2019 4.5    Assessment/Plan: Goal potassium ~ 4 and magnesium ~ 2.  9/21: K 4.4  Mag 1.8  Phos 3.6  Scr 0.84 No supplementation at this time. Will order electrolytes with morning labs.  Pharmacy to continue to follow patient and replace electrolytes per consult.  Bari MantisKristin Donielle Radziewicz PharmD Clinical Pharmacist 02/15/2018

## 2018-02-15 NOTE — Plan of Care (Signed)
Patient on BiPap qhs.  Able to tolerate being off Bipap for extended periods of time 10-15 minutes.  O2 sats remained > 92% while off BiPap. No c/o discomfort this shift.   Wife at bedside.  Will continue to monitor.
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
## 2018-02-16 LAB — BLOOD GAS, ARTERIAL
ACID-BASE DEFICIT: 0.7 mmol/L (ref 0.0–2.0)
BICARBONATE: 23.4 mmol/L (ref 20.0–28.0)
DELIVERY SYSTEMS: POSITIVE
EXPIRATORY PAP: 6
FIO2: 0.35
Inspiratory PAP: 12
O2 Saturation: 95.3 %
PATIENT TEMPERATURE: 37
PH ART: 7.42 (ref 7.350–7.450)
pCO2 arterial: 36 mmHg (ref 32.0–48.0)
pO2, Arterial: 76 mmHg — ABNORMAL LOW (ref 83.0–108.0)

## 2018-02-16 LAB — CBC
HCT: 36.2 % — ABNORMAL LOW (ref 40.0–52.0)
Hemoglobin: 12.6 g/dL — ABNORMAL LOW (ref 13.0–18.0)
MCH: 34.5 pg — AB (ref 26.0–34.0)
MCHC: 34.8 g/dL (ref 32.0–36.0)
MCV: 99 fL (ref 80.0–100.0)
PLATELETS: 136 10*3/uL — AB (ref 150–440)
RBC: 3.66 MIL/uL — AB (ref 4.40–5.90)
RDW: 13.6 % (ref 11.5–14.5)
WBC: 8.2 10*3/uL (ref 3.8–10.6)

## 2018-02-16 LAB — GLUCOSE, CAPILLARY
GLUCOSE-CAPILLARY: 156 mg/dL — AB (ref 70–99)
Glucose-Capillary: 142 mg/dL — ABNORMAL HIGH (ref 70–99)

## 2018-02-16 LAB — MAGNESIUM: Magnesium: 1.6 mg/dL — ABNORMAL LOW (ref 1.7–2.4)

## 2018-02-16 LAB — BASIC METABOLIC PANEL
Anion gap: 8 (ref 5–15)
BUN: 22 mg/dL — AB (ref 6–20)
CALCIUM: 8.8 mg/dL — AB (ref 8.9–10.3)
CO2: 25 mmol/L (ref 22–32)
CREATININE: 1.01 mg/dL (ref 0.61–1.24)
Chloride: 100 mmol/L (ref 98–111)
GFR calc non Af Amer: >60 mL/min (ref >60)
GLUCOSE: 154 mg/dL — AB (ref 70–99)
Potassium: 3.5 mmol/L (ref 3.5–5.1)
Sodium: 133 mmol/L — ABNORMAL LOW (ref 135–145)

## 2018-02-16 MED ORDER — AMOXICILLIN-POT CLAVULANATE 875-125 MG PO TABS: 1.0000 | ORAL_TABLET | Freq: Two times a day (BID) | ORAL | 0 refills | Status: AC

## 2018-02-16 MED ORDER — LEVETIRACETAM 500 MG PO TABS: 500.0000 mg | ORAL_TABLET | Freq: Two times a day (BID) | ORAL | 1 refills | Status: AC

## 2018-02-16 MED ORDER — POTASSIUM CHLORIDE CRYS ER 20 MEQ PO TBCR
40.0000 meq | EXTENDED_RELEASE_TABLET | Freq: Once | ORAL | Status: AC
  Administered 2018-02-16: 40 meq via ORAL
  Filled 2018-02-16: qty 2

## 2018-02-16 MED ORDER — MAGNESIUM SULFATE 2 GM/50ML IV SOLN
2.0000 g | Freq: Once | INTRAVENOUS | Status: AC
  Administered 2018-02-16: 2 g via INTRAVENOUS
  Filled 2018-02-16: qty 50

## 2018-02-16 MED ORDER — OXYCODONE-ACETAMINOPHEN 2.5-325 MG PO TABS: 1.0000 | ORAL_TABLET | ORAL | 0 refills | Status: AC | PRN

## 2018-02-16 NOTE — Progress Notes (Signed)
Discharged to home with his wife.  Instructed not to drive until cleared by a physician.
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
